# Patient Record
Sex: Male | Born: 2005 | Race: White | Hispanic: No | Marital: Single | Smoking: Never smoker
Health system: Southern US, Community
[De-identification: ages and names within clinical notes are randomized; demographics above are authoritative.]

## PROBLEM LIST (undated history)

## (undated) DIAGNOSIS — T7840XA Allergy, unspecified, initial encounter: Secondary | ICD-10-CM

## (undated) DIAGNOSIS — F909 Attention-deficit hyperactivity disorder, unspecified type: Secondary | ICD-10-CM

## (undated) DIAGNOSIS — I514 Myocarditis, unspecified: Secondary | ICD-10-CM

## (undated) HISTORY — PX: TYMPANOSTOMY TUBE PLACEMENT: SHX32

## (undated) HISTORY — DX: Allergy, unspecified, initial encounter: T78.40XA

## (undated) HISTORY — PX: TONSILLECTOMY: SUR1361

## (undated) HISTORY — DX: Attention-deficit hyperactivity disorder, unspecified type: F90.9

---
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
## 2024-01-02 ENCOUNTER — Other Ambulatory Visit (HOSPITAL_BASED_OUTPATIENT_CLINIC_OR_DEPARTMENT_OTHER): Payer: Self-pay

## 2024-01-02 MED ORDER — LISDEXAMFETAMINE DIMESYLATE 30 MG PO CAPS
30.0000 mg | ORAL_CAPSULE | Freq: Every day | ORAL | 0 refills | Status: DC
  Filled 2024-01-02: qty 30, 30d supply, fill #0

## 2024-02-26 ENCOUNTER — Other Ambulatory Visit (HOSPITAL_BASED_OUTPATIENT_CLINIC_OR_DEPARTMENT_OTHER): Payer: Self-pay

## 2024-02-26 MED ORDER — LISDEXAMFETAMINE DIMESYLATE 30 MG PO CAPS
30.0000 mg | ORAL_CAPSULE | Freq: Every day | ORAL | 0 refills | Status: AC
  Filled 2024-02-26: qty 30, 30d supply, fill #0
